# Patient Record
Sex: Female | Born: 1965 | Race: White | Hispanic: No | Marital: Married
Health system: Southern US, Community
[De-identification: ages and names within clinical notes are randomized; demographics above are authoritative.]

## PROBLEM LIST (undated history)

## (undated) DIAGNOSIS — I10 Essential (primary) hypertension: Secondary | ICD-10-CM

## (undated) DIAGNOSIS — S72009A Fracture of unspecified part of neck of unspecified femur, initial encounter for closed fracture: Secondary | ICD-10-CM

---

## 1998-07-14 ENCOUNTER — Ambulatory Visit (HOSPITAL_COMMUNITY): Admission: RE | Admit: 1998-07-14 | Discharge: 1998-07-14 | Payer: Self-pay | Admitting: *Deleted

## 2002-05-22 ENCOUNTER — Encounter: Payer: Self-pay | Admitting: Family Medicine

## 2002-05-22 ENCOUNTER — Ambulatory Visit (HOSPITAL_COMMUNITY): Admission: RE | Admit: 2002-05-22 | Discharge: 2002-05-22 | Payer: Self-pay | Admitting: Family Medicine

## 2003-06-05 ENCOUNTER — Encounter: Payer: Self-pay | Admitting: Family Medicine

## 2003-06-05 ENCOUNTER — Ambulatory Visit (HOSPITAL_COMMUNITY): Admission: RE | Admit: 2003-06-05 | Discharge: 2003-06-05 | Payer: Self-pay | Admitting: Family Medicine

## 2004-06-23 ENCOUNTER — Ambulatory Visit (HOSPITAL_COMMUNITY): Admission: RE | Admit: 2004-06-23 | Discharge: 2004-06-23 | Payer: Self-pay | Admitting: Family Medicine

## 2005-07-05 ENCOUNTER — Ambulatory Visit (HOSPITAL_COMMUNITY): Admission: RE | Admit: 2005-07-05 | Discharge: 2005-07-05 | Payer: Self-pay | Admitting: Obstetrics and Gynecology

## 2005-11-01 ENCOUNTER — Other Ambulatory Visit: Admission: RE | Admit: 2005-11-01 | Discharge: 2005-11-01 | Payer: Self-pay | Admitting: Obstetrics and Gynecology

## 2006-01-07 ENCOUNTER — Ambulatory Visit (HOSPITAL_COMMUNITY): Admission: RE | Admit: 2006-01-07 | Discharge: 2006-01-07 | Payer: Self-pay | Admitting: Obstetrics and Gynecology

## 2006-01-07 ENCOUNTER — Encounter (INDEPENDENT_AMBULATORY_CARE_PROVIDER_SITE_OTHER): Payer: Self-pay | Admitting: *Deleted

## 2006-07-06 ENCOUNTER — Ambulatory Visit (HOSPITAL_COMMUNITY): Admission: RE | Admit: 2006-07-06 | Discharge: 2006-07-06 | Payer: Self-pay | Admitting: Obstetrics and Gynecology

## 2007-07-14 ENCOUNTER — Ambulatory Visit (HOSPITAL_COMMUNITY): Admission: RE | Admit: 2007-07-14 | Discharge: 2007-07-14 | Payer: Self-pay | Admitting: Obstetrics and Gynecology

## 2008-07-26 ENCOUNTER — Ambulatory Visit (HOSPITAL_COMMUNITY): Admission: RE | Admit: 2008-07-26 | Discharge: 2008-07-26 | Payer: Self-pay | Admitting: Obstetrics and Gynecology

## 2009-08-12 ENCOUNTER — Ambulatory Visit (HOSPITAL_COMMUNITY): Admission: RE | Admit: 2009-08-12 | Discharge: 2009-08-12 | Payer: Self-pay | Admitting: Obstetrics and Gynecology

## 2010-08-24 ENCOUNTER — Ambulatory Visit (HOSPITAL_COMMUNITY): Admission: RE | Admit: 2010-08-24 | Discharge: 2010-08-24 | Payer: Self-pay | Admitting: Obstetrics and Gynecology

## 2011-04-30 NOTE — Op Note (Signed)
[name], [name]               ACCOUNT NO.:  [id number]   MEDICAL RECORD NO.:  [id number]          PATIENT TYPE:  AMB   LOCATION:  SDC                           FACILITY:  [facility]   PHYSICIAN:  Michelle L. Grewal, M.D.DATE OF BIRTH:  01/17/1966   DATE OF PROCEDURE:  01/07/2006  DATE OF DISCHARGE:                                 OPERATIVE REPORT   PREOPERATIVE DIAGNOSIS:  Endometrial polyp.   POSTOPERATIVE DIAGNOSIS:  Endometrial polyp.   PROCEDURE:  Dilatation and curettage.   SURGEON:  Dr. Vincente Poli   ANESTHESIA:  MAC with local.   SPECIMENS:  Uterine curettings.   ESTIMATED BLOOD LOSS:  Minimal.   COMPLICATIONS:  None.   PROCEDURE:  The patient was taken to the operating room.  She was given  sedation.  She was then prepped and draped in the usual sterile fashion.  In  and out catheter was used to empty the bladder.  A speculum was inserted.  The cervix was grasped with a tenaculum, and a paracervical block was  performed in the standard fashion.  The cervical internal os was gently  dilated using Pratt dilators.  A sharp curette was inserted, in the uterus  was curetted x2 with retrieval of contents that grossly looked consistent  with a polyp.  Remainder of tissue was removed with polyp forceps.  All  instruments were removed the vagina.  Minimal bleeding was noted.  The  patient was taken to the recovery room in stable condition.  Of note,  sponge, lap and instrument counts were correct x2.      Michelle L. Vincente Poli, M.D.  Electronically Signed     MLG/MEDQ  D:  01/07/2006  T:  01/08/2006  Job:  [id number]

## 2011-09-06 ENCOUNTER — Other Ambulatory Visit (HOSPITAL_COMMUNITY): Payer: Self-pay | Admitting: Obstetrics and Gynecology

## 2011-09-06 DIAGNOSIS — Z1231 Encounter for screening mammogram for malignant neoplasm of breast: Secondary | ICD-10-CM

## 2011-09-13 ENCOUNTER — Ambulatory Visit (HOSPITAL_COMMUNITY)
Admission: RE | Admit: 2011-09-13 | Discharge: 2011-09-13 | Disposition: A | Discharge status: Home / Self Care | Payer: PRIVATE HEALTH INSURANCE | Source: Ambulatory Visit | Attending: Obstetrics and Gynecology | Admitting: Obstetrics and Gynecology

## 2011-09-13 DIAGNOSIS — Z1231 Encounter for screening mammogram for malignant neoplasm of breast: Secondary | ICD-10-CM | POA: Insufficient documentation

## 2012-09-05 ENCOUNTER — Other Ambulatory Visit: Payer: Self-pay | Admitting: Obstetrics and Gynecology

## 2012-10-03 ENCOUNTER — Other Ambulatory Visit (HOSPITAL_COMMUNITY): Payer: Self-pay | Admitting: Obstetrics and Gynecology

## 2012-10-03 DIAGNOSIS — Z1231 Encounter for screening mammogram for malignant neoplasm of breast: Secondary | ICD-10-CM

## 2012-10-19 ENCOUNTER — Ambulatory Visit (HOSPITAL_COMMUNITY)
Admission: RE | Admit: 2012-10-19 | Discharge: 2012-10-19 | Disposition: A | Discharge status: Home / Self Care | Payer: PRIVATE HEALTH INSURANCE | Source: Ambulatory Visit | Attending: Obstetrics and Gynecology | Admitting: Obstetrics and Gynecology

## 2012-10-19 DIAGNOSIS — Z1231 Encounter for screening mammogram for malignant neoplasm of breast: Secondary | ICD-10-CM | POA: Insufficient documentation

## 2013-10-24 ENCOUNTER — Other Ambulatory Visit (HOSPITAL_COMMUNITY): Payer: Self-pay | Admitting: Obstetrics and Gynecology

## 2013-10-24 DIAGNOSIS — Z1231 Encounter for screening mammogram for malignant neoplasm of breast: Secondary | ICD-10-CM

## 2013-10-26 ENCOUNTER — Other Ambulatory Visit: Payer: Self-pay | Admitting: Obstetrics and Gynecology

## 2013-11-09 ENCOUNTER — Ambulatory Visit (HOSPITAL_COMMUNITY)
Admission: RE | Admit: 2013-11-09 | Discharge: 2013-11-09 | Disposition: A | Discharge status: Home / Self Care | Payer: PRIVATE HEALTH INSURANCE | Source: Ambulatory Visit | Attending: Obstetrics and Gynecology | Admitting: Obstetrics and Gynecology

## 2013-11-09 DIAGNOSIS — Z1231 Encounter for screening mammogram for malignant neoplasm of breast: Secondary | ICD-10-CM | POA: Insufficient documentation

## 2014-11-18 ENCOUNTER — Other Ambulatory Visit: Payer: Self-pay | Admitting: Obstetrics and Gynecology

## 2014-11-19 LAB — CYTOLOGY - PAP

## 2020-10-11 ENCOUNTER — Other Ambulatory Visit: Payer: Self-pay

## 2020-10-11 ENCOUNTER — Ambulatory Visit: Payer: PRIVATE HEALTH INSURANCE | Attending: Internal Medicine

## 2020-10-11 DIAGNOSIS — Z23 Encounter for immunization: Secondary | ICD-10-CM

## 2020-10-11 NOTE — Progress Notes (Signed)
   Covid-19 Vaccination Clinic  Name:  Janet Carter    MRN: [id number] DOB: 1966-09-23  10/11/2020  [name] was observed post Covid-19 immunization for 15 minutes without incident. She was provided with Vaccine Information Sheet and instruction to access the V-Safe system.   [name] was instructed to call 911 with any severe reactions post vaccine: Marland Kitchen Difficulty breathing  . Swelling of face and throat  . A fast heartbeat  . A bad rash all over body  . Dizziness and weakness

## 2021-02-05 ENCOUNTER — Emergency Department (HOSPITAL_COMMUNITY): Payer: PRIVATE HEALTH INSURANCE

## 2021-02-05 ENCOUNTER — Encounter (HOSPITAL_COMMUNITY): Payer: Self-pay

## 2021-02-05 ENCOUNTER — Emergency Department (HOSPITAL_COMMUNITY)
Admission: EM | Admit: 2021-02-05 | Discharge: 2021-02-05 | Disposition: A | Discharge status: Home / Self Care | Payer: PRIVATE HEALTH INSURANCE | Attending: Emergency Medicine | Admitting: Emergency Medicine

## 2021-02-05 DIAGNOSIS — W010XXA Fall on same level from slipping, tripping and stumbling without subsequent striking against object, initial encounter: Secondary | ICD-10-CM | POA: Insufficient documentation

## 2021-02-05 DIAGNOSIS — Y9301 Activity, walking, marching and hiking: Secondary | ICD-10-CM | POA: Insufficient documentation

## 2021-02-05 DIAGNOSIS — I10 Essential (primary) hypertension: Secondary | ICD-10-CM | POA: Diagnosis not present

## 2021-02-05 DIAGNOSIS — S0181XA Laceration without foreign body of other part of head, initial encounter: Secondary | ICD-10-CM | POA: Diagnosis not present

## 2021-02-05 DIAGNOSIS — S0191XA Laceration without foreign body of unspecified part of head, initial encounter: Secondary | ICD-10-CM | POA: Diagnosis present

## 2021-02-05 DIAGNOSIS — Y9289 Other specified places as the place of occurrence of the external cause: Secondary | ICD-10-CM | POA: Insufficient documentation

## 2021-02-05 HISTORY — DX: Essential (primary) hypertension: I10

## 2021-02-05 HISTORY — DX: Fracture of unspecified part of neck of unspecified femur, initial encounter for closed fracture: S72.009A

## 2021-02-05 MED ORDER — ACETAMINOPHEN 500 MG PO TABS
1000.0000 mg | ORAL_TABLET | Freq: Once | ORAL | Status: AC
  Administered 2021-02-05: 1000 mg via ORAL
  Filled 2021-02-05: qty 2

## 2021-02-05 MED ORDER — BACITRACIN ZINC 500 UNIT/GM EX OINT
TOPICAL_OINTMENT | Freq: Two times a day (BID) | CUTANEOUS | Status: DC
  Filled 2021-02-05: qty 1.8

## 2021-02-05 NOTE — Discharge Instructions (Signed)
CAT scan of the head was normal.  However you may experience some concussion symptoms.  It is fine to sleep.  Take tylenol and ibuprofen for pain.

## 2021-02-05 NOTE — ED Provider Notes (Signed)
[facility]-EMERGENCY DEPT Provider Note   CSN: [id number] Arrival date & time: 02/05/21  1302     History Chief Complaint  Patient presents with  . Fall  . Head Laceration    Janet Carter is a 55 y.o. female.  Patient is a 55 year old female with a history of hypertension presenting today after she slipped and fell.  Patient was walking into a tire shop when she stepped up on the sidewalk and slipped falling forward smacking the left forehead on the ground.  She denies loss of consciousness but is having pain over the laceration.  She has no vision changes, neck pain.  She has been able to ambulate without difficulty and denies any pain in her hands or feet.  She takes no anticoagulation.  Tetanus shot is up-to-date.  The history is provided by the patient.  Fall This is a new problem. The current episode started less than 1 hour ago. The problem occurs constantly. The problem has not changed since onset.Associated symptoms include headaches. Nothing aggravates the symptoms. Nothing relieves the symptoms.  Head Laceration Associated symptoms include headaches.       Past Medical History:  Diagnosis Date  . Fractured hip (HCC)   . Hypertension     There are no problems to display for this patient.   History reviewed. No pertinent surgical history.   OB History   No obstetric history on file.     History reviewed. No pertinent family history.     Home Medications Prior to Admission medications   Not on File    Allergies    Patient has no known allergies.  Review of Systems   Review of Systems  Neurological: Positive for headaches.  All other systems reviewed and are negative.   Physical Exam Updated Vital Signs BP (!) 170/109 (BP Location: Left Arm)   Pulse 93   Temp 98.3 F (36.8 C) (Oral)   Resp 18   LMP 11/03/2013   SpO2 99%   Physical Exam Vitals and nursing note reviewed.  Constitutional:      General: She is not in  acute distress.    Appearance: She is well-developed, normal weight and well-nourished.  HENT:     Head: Normocephalic. Laceration present.      Comments: No tenderness over the supraorbital or infraorbital bones bilaterally.    Nose: Nose normal.     Mouth/Throat:     Mouth: Mucous membranes are moist.  Eyes:     Extraocular Movements: Extraocular movements intact and EOM normal.     Conjunctiva/sclera: Conjunctivae normal.     Pupils: Pupils are equal, round, and reactive to light.  Cardiovascular:     Rate and Rhythm: Normal rate and regular rhythm.     Pulses: Intact distal pulses.     Heart sounds: Normal heart sounds. No murmur heard. No friction rub.  Pulmonary:     Effort: Pulmonary effort is normal.     Breath sounds: Normal breath sounds. No wheezing or rales.  Abdominal:     General: Bowel sounds are normal. There is no distension.     Palpations: Abdomen is soft.     Tenderness: There is no abdominal tenderness. There is no guarding or rebound.  Musculoskeletal:        General: No tenderness. Normal range of motion.     Cervical back: Normal range of motion and neck supple. No tenderness. No spinous process tenderness or muscular tenderness.     Comments:  No edema  Skin:    General: Skin is warm and dry.     Capillary Refill: Capillary refill takes 2 to 3 seconds.     Findings: No rash.  Neurological:     General: No focal deficit present.     Mental Status: She is alert and oriented to person, place, and time. Mental status is at baseline.     Cranial Nerves: No cranial nerve deficit.     Sensory: No sensory deficit.     Motor: No weakness.     Gait: Gait normal.  Psychiatric:        Mood and Affect: Mood and affect and mood normal.        Behavior: Behavior normal.        Thought Content: Thought content normal.     ED Results / Procedures / Treatments   Labs (all labs ordered are listed, but only abnormal results are displayed) Labs Reviewed - No data  to display  EKG None  Radiology CT Head Wo Contrast  Result Date: 02/05/2021 CLINICAL DATA:  Pain following fall EXAM: CT HEAD WITHOUT CONTRAST TECHNIQUE: Contiguous axial images were obtained from the base of the skull through the vertex without intravenous contrast. COMPARISON:  None. FINDINGS: Brain: Ventricles and sulci are normal in size and configuration. There is no intracranial mass, hemorrhage, extra-axial fluid collection, or midline shift. Brain parenchyma appears unremarkable. No evident acute infarct. Vascular: There is no hyperdense vessel. No appreciable vascular calcification. Skull: Bony calvarium appears intact. There is mild soft tissue air in the inferior left frontal scalp. Sinuses/Orbits: There is mild mucosal thickening in several ethmoid air cells. Orbits appear symmetric bilaterally. Other: Mastoid air cells are clear. IMPRESSION: Normal appearing brain parenchyma. No mass or hemorrhage. No extra-axial fluid. Mild soft tissue air in the inferior left frontal scalp region. No associated fracture. Mucosal thickening noted in several ethmoid air cells. Electronically Signed   By: Bretta Bang III M.D.   On: 02/05/2021 15:33    Procedures Procedures  LACERATION REPAIR Performed by: [facility] Authorized by: Gwyneth Sprout Consent: Verbal consent obtained. Risks and benefits: risks, benefits and alternatives were discussed Consent given by: patient Patient identity confirmed: provided demographic data Prepped and Draped in normal sterile fashion Wound explored  Laceration Location: left forehead  Laceration Length: 4cm  No Foreign Bodies seen or palpated  Anesthesia: local infiltration  Local anesthetic: lidocaine 2% with epinephrine  Anesthetic total: 5 ml  Irrigation method: syringe Amount of cleaning: standard  Skin closure: 6.0 prolene and 5.0 vicryl rapide  Number of sutures: 16 prolene and 4 vicryl  Technique: simple interrupted prolene  and buried vicryl  Patient tolerance: Patient tolerated the procedure well with no immediate complications.  Medications Ordered in ED Medications - No data to display  ED Course  I have reviewed the triage vital signs and the nursing notes.  Pertinent labs & imaging results that were available during my care of the patient were reviewed by me and considered in my medical decision making (see chart for details).    MDM Rules/Calculators/A&P                          Patient presenting today after a trip and fall where she hit face first onto the concrete.  She has a 4 cm laceration over her left eyebrow but no other evidence of injury.  She is neurologically intact and tetanus shot is up-to-date.  She  does not take anticoagulation.  She has no visual changes and has no facial bone pain.  Head CT to rule out injury pending.  Wound repaired as above.  4:01 PM Head CT neg for acute process.  Pt d/ced home.  MDM Number of Diagnoses or Management Options   Amount and/or Complexity of Data Reviewed Tests in the radiology section of CPT: ordered and reviewed Independent visualization of images, tracings, or specimens: yes    Final Clinical Impression(s) / ED Diagnoses Final diagnoses:  Facial laceration, initial encounter    Rx / DC Orders ED Discharge Orders    None       Gwyneth Sprout, MD 02/05/21 [phone]

## 2021-02-05 NOTE — ED Triage Notes (Signed)
Pt presents with c/o fall and head laceration. Pt fell at the tire shop, hitting her forehead on the concrete. Pt has an approx 2 inch laceration about her left eyebrow. Pt is not on blood thinners, no LOC. Pt c/o headache, bleeding controlled.

## 2022-07-25 IMAGING — CT CT HEAD W/O CM
3 series · 16 of 47 positions shown, 19 images · non-contrast
Comparison: None.

CLINICAL DATA: Pain following fall

EXAM:
CT HEAD WITHOUT CONTRAST
TECHNIQUE: Contiguous axial images were obtained from the base of the skull
through the vertex without intravenous contrast.

[Series 2: head wo · axial · 0.45mm/px · z∈[-30,+105]mm · 10 of 33 slices shown, 13 images]
[im 3/33  brain]
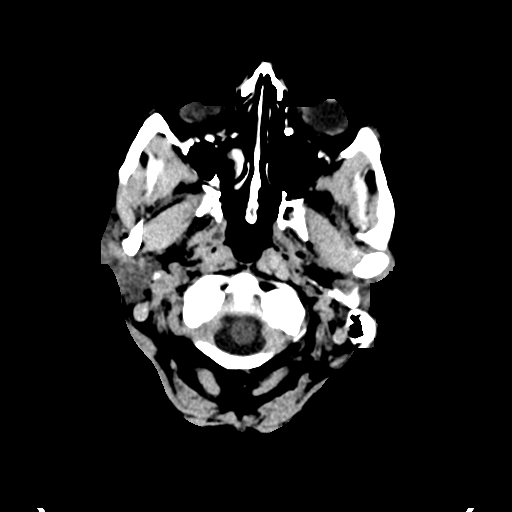
[im 3/33  bone]
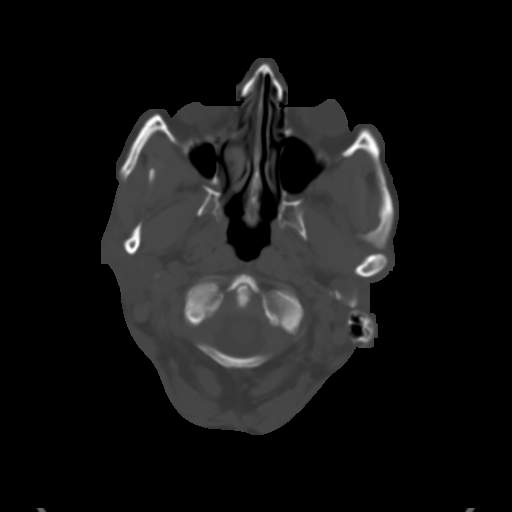
[im 6/33  brain]
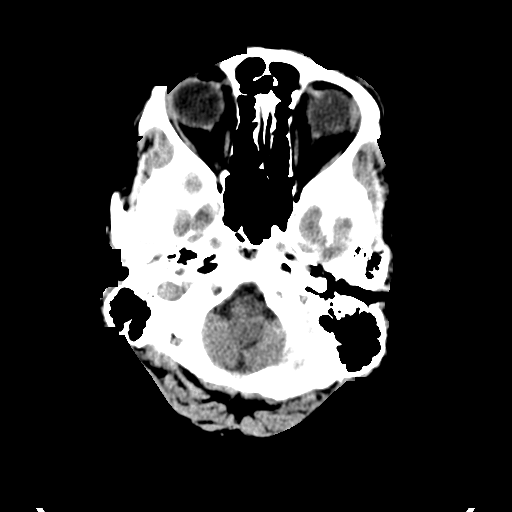
[im 9/33  brain]
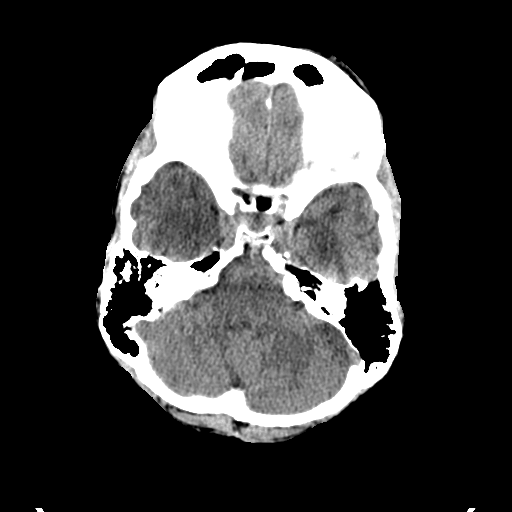
[im 12/33  brain]
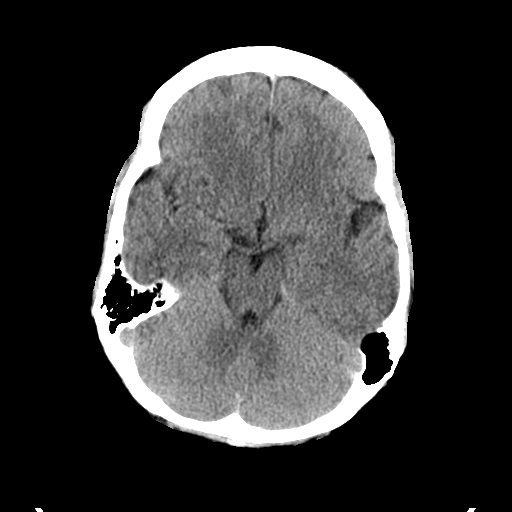
[im 15/33  brain]
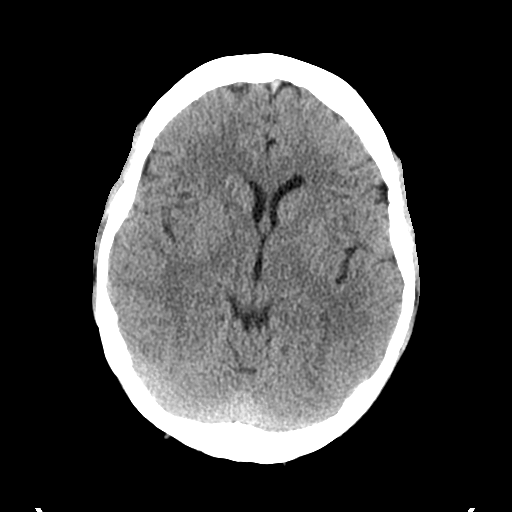
[im 15/33  bone]
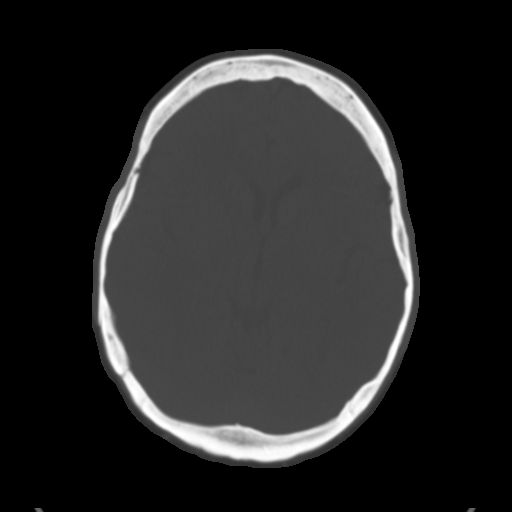
[im 18/33  brain]
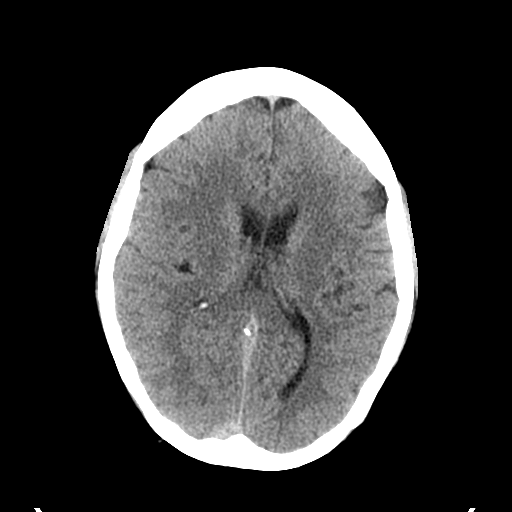
[im 21/33  brain]
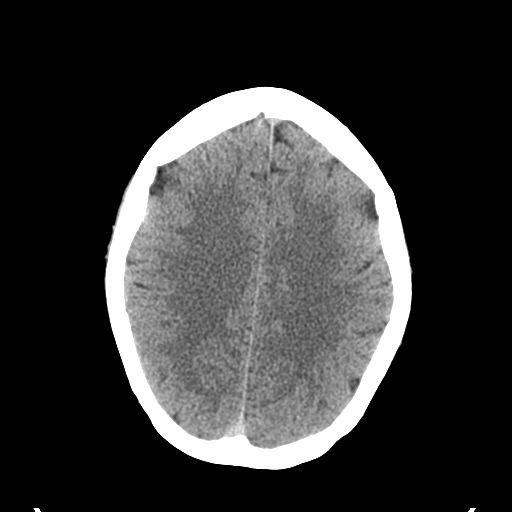
[im 25/33  brain]
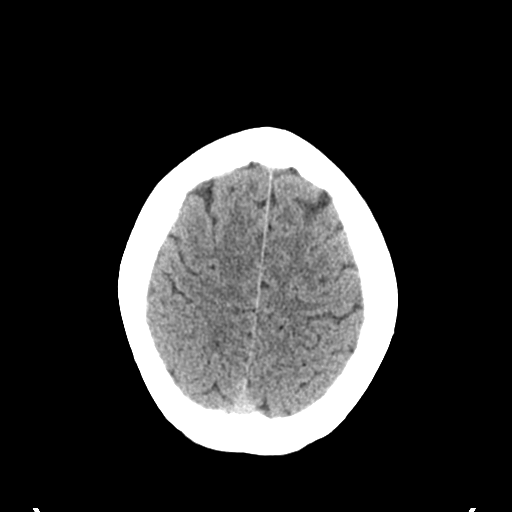
[im 27/33  brain]
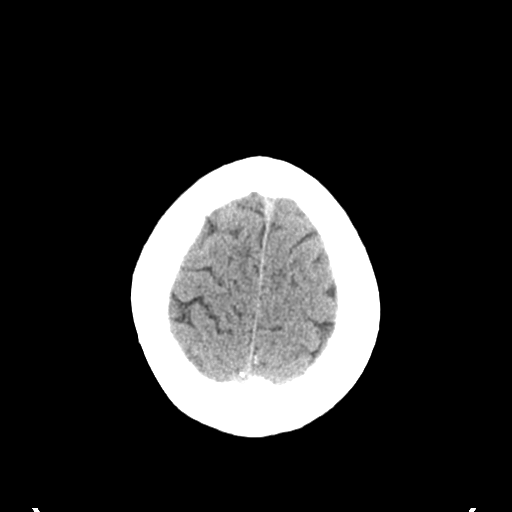
[im 27/33  bone]
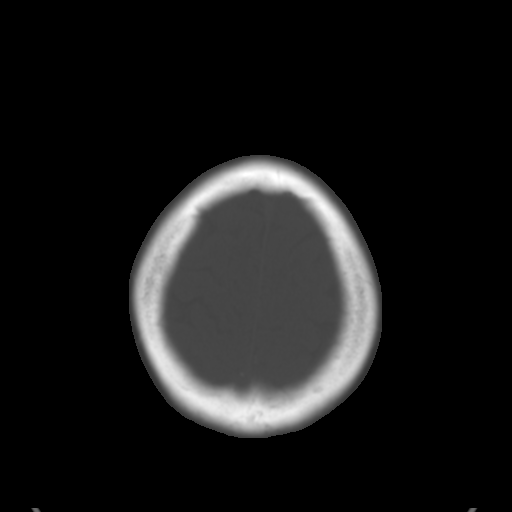
[im 30/33  brain]
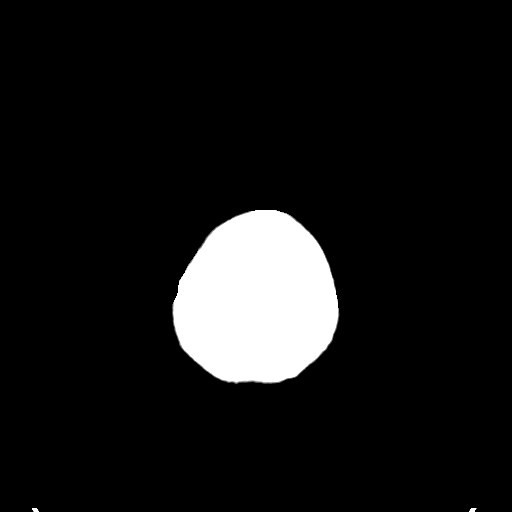

[Series 4: coronal soft tissue · coronal · 0.34mm/px · 3 of 65 slices shown]
[im 22/65  brain]
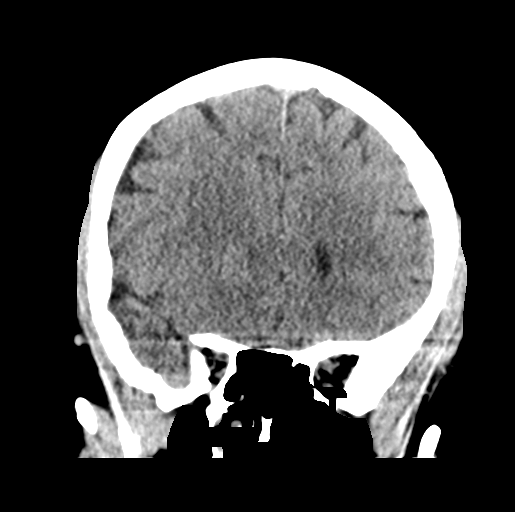
[im 29/65  brain]
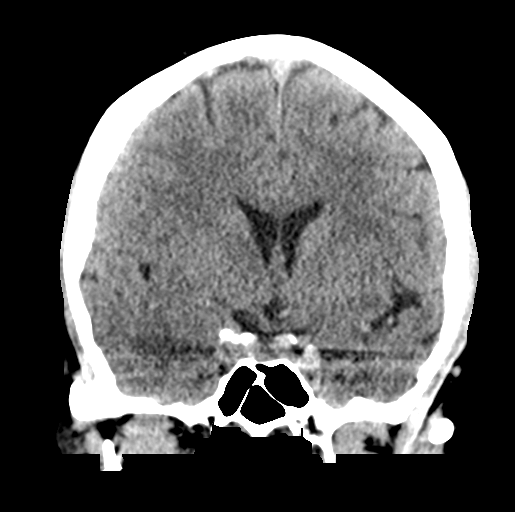
[im 36/65  brain]
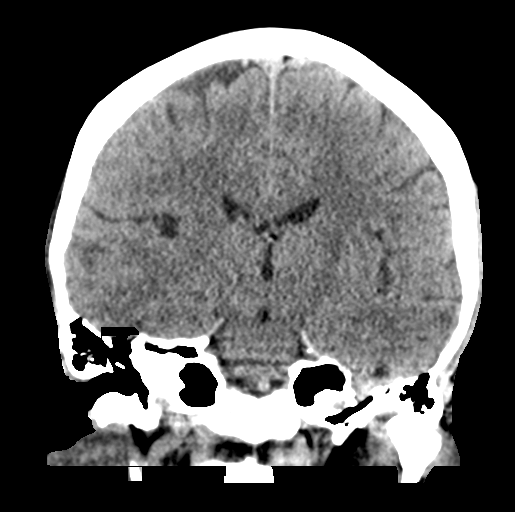

[Series 5: sagittal soft tissue · sagittal · 0.37mm/px · 3 of 64 slices shown]
[im 22/64  brain]
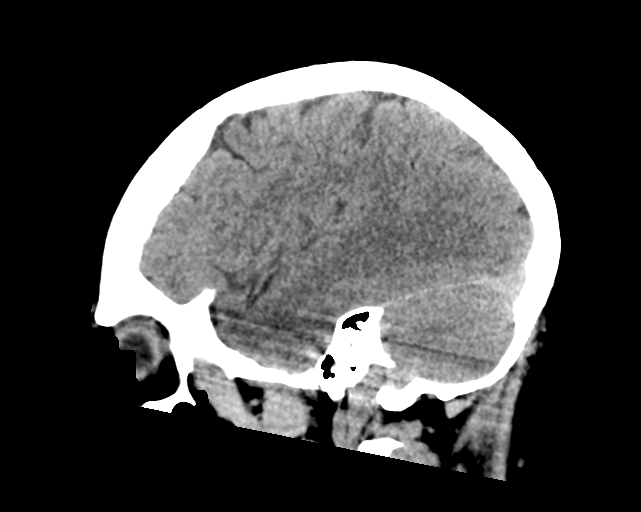
[im 32/64  brain]
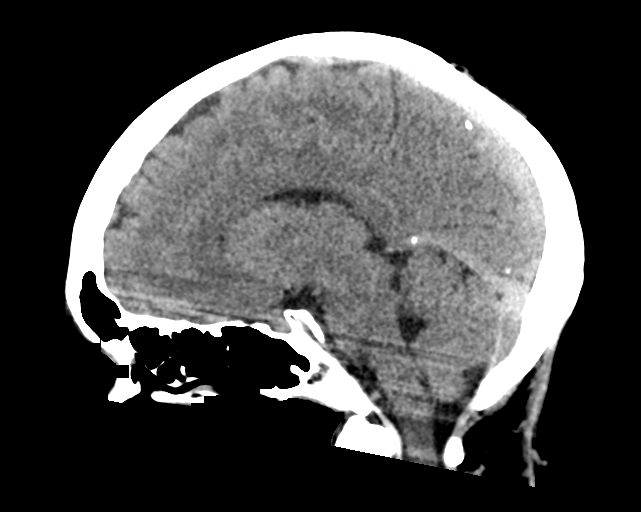
[im 43/64  brain]
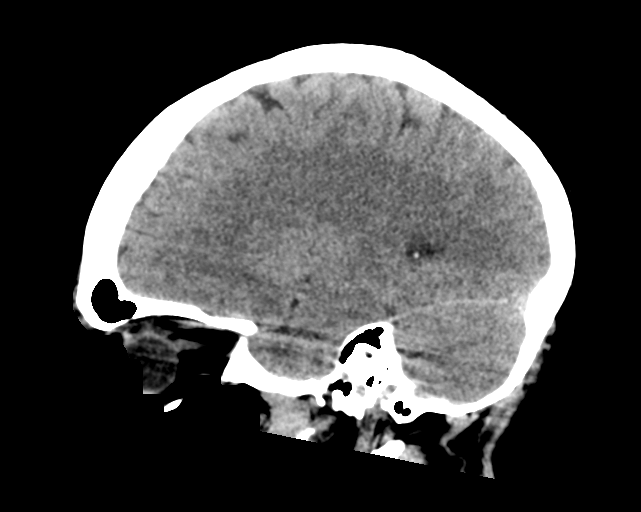

[16 of 47 positions shown; findings below may reference images not displayed]

FINDINGS: Brain: Ventricles and sulci are normal in size and configuration.
There is no intracranial mass, hemorrhage, extra-axial fluid
collection, or midline shift. Brain parenchyma appears unremarkable.
No evident acute infarct.

Vascular: There is no hyperdense vessel. No appreciable vascular
calcification.

Skull: Bony calvarium appears intact. There is mild soft tissue air
in the inferior left frontal scalp.

Sinuses/Orbits: There is mild mucosal thickening in several ethmoid
air cells. Orbits appear symmetric bilaterally.

Other: Mastoid air cells are clear.
IMPRESSION: Normal appearing brain parenchyma. No mass or hemorrhage. No
extra-axial fluid.

Mild soft tissue air in the inferior left frontal scalp region. No
associated fracture.

Mucosal thickening noted in several ethmoid air cells.
